# Patient Record
Sex: Male | Born: 1990 | Race: Black or African American | Hispanic: No | Smoking: Never smoker
Health system: Southern US, Community
[De-identification: ages and names within clinical notes are randomized; demographics above are authoritative.]

---

## 2012-09-05 ENCOUNTER — Emergency Department (HOSPITAL_BASED_OUTPATIENT_CLINIC_OR_DEPARTMENT_OTHER)
Admission: EM | Admit: 2012-09-05 | Discharge: 2012-09-05 | Disposition: A | Discharge status: Home / Self Care | Payer: 59 | Attending: Emergency Medicine | Admitting: Emergency Medicine

## 2012-09-05 ENCOUNTER — Encounter (HOSPITAL_BASED_OUTPATIENT_CLINIC_OR_DEPARTMENT_OTHER): Payer: Self-pay

## 2012-09-05 DIAGNOSIS — R110 Nausea: Secondary | ICD-10-CM | POA: Insufficient documentation

## 2012-09-05 DIAGNOSIS — Y9389 Activity, other specified: Secondary | ICD-10-CM | POA: Insufficient documentation

## 2012-09-05 DIAGNOSIS — Y9289 Other specified places as the place of occurrence of the external cause: Secondary | ICD-10-CM | POA: Insufficient documentation

## 2012-09-05 DIAGNOSIS — X31XXXA Exposure to excessive natural cold, initial encounter: Secondary | ICD-10-CM | POA: Insufficient documentation

## 2012-09-05 DIAGNOSIS — T698XXA Other specified effects of reduced temperature, initial encounter: Secondary | ICD-10-CM | POA: Insufficient documentation

## 2012-09-05 MED ORDER — PROMETHAZINE HCL 25 MG/ML IJ SOLN
INTRAMUSCULAR | Status: AC
  Administered 2012-09-05: 12.5 mg via INTRAMUSCULAR
  Filled 2012-09-05: qty 1

## 2012-09-05 MED ORDER — PROMETHAZINE HCL 25 MG/ML IJ SOLN: 12.5000 mg | Freq: Once | INTRAMUSCULAR | Status: AC

## 2012-09-05 MED ORDER — ONDANSETRON 8 MG PO TBDP: ORAL_TABLET | ORAL | Status: DC

## 2012-09-05 MED ORDER — ONDANSETRON 8 MG PO TBDP: 8.0000 mg | ORAL_TABLET | Freq: Once | ORAL | Status: AC

## 2012-09-05 MED ORDER — ONDANSETRON 8 MG PO TBDP
ORAL_TABLET | ORAL | Status: AC
  Administered 2012-09-05: 8 mg via ORAL
  Filled 2012-09-05: qty 1

## 2012-09-05 NOTE — ED Provider Notes (Signed)
History     CSN: [id number]  Arrival date & time 09/05/12  0139   First MD Initiated Contact with Patient 09/05/12 [phone]      Chief Complaint  Patient presents with  . Cold Exposure  . Nausea  Patient awoke feeling queasy this am.  Has been exposed to coworkers with n/v/d.  Went to work [employer] and the symptoms got worse over 3 hours.  No vomiting. No f/c/r.  No shivering no diarrhea.  Was covered the entire time.  No trauma.  No sore throat.  No LOC.    (Consider location/radiation/quality/duration/timing/severity/associated sxs/prior treatment) The history is provided by the patient.    History reviewed. No pertinent past medical history.  History reviewed. No pertinent past surgical history.  History reviewed. No pertinent family history.  History  Substance Use Topics  . Smoking status: Not on file  . Smokeless tobacco: Not on file  . Alcohol Use: Not on file      Review of Systems  Constitutional: Negative for fever.  HENT: Negative for neck pain and neck stiffness.   Respiratory: Negative for chest tightness.   Cardiovascular: Negative for chest pain.  Gastrointestinal: Positive for nausea. Negative for vomiting and diarrhea.  All other systems reviewed and are negative.    Allergies  Review of patient's allergies indicates no known allergies.  Home Medications  No current outpatient prescriptions on file.  BP 113/78  Pulse 63  Temp(Src) 97.3 F (36.3 C) (Oral)  Resp 18  Ht 5\' 9"  (1.753 m)  Wt 205 lb (92.987 kg)  BMI 30.26 kg/m2  SpO2 99%  Physical Exam  Constitutional: He is oriented to person, place, and time. He appears well-developed and well-nourished. No distress.  HENT:  Head: Normocephalic and atraumatic.  Right Ear: Tympanic membrane is not injected.  Left Ear: Tympanic membrane is not injected.  Mouth/Throat: Oropharynx is clear and moist.  Eyes: Conjunctivae are normal. Pupils are equal, round, and reactive to light.  Neck:  Normal range of motion. Neck supple.  Cardiovascular: Normal rate, regular rhythm and intact distal pulses.   Pulmonary/Chest: Effort normal and breath sounds normal. He has no wheezes. He has no rales.  Abdominal: Soft. Bowel sounds are normal. There is no tenderness. There is no rebound and no guarding.  Musculoskeletal: Normal range of motion.  Lymphadenopathy:    He has no cervical adenopathy.  Neurological: He is alert and oriented to person, place, and time. He has normal reflexes.  Skin: Skin is warm and dry.  Psychiatric: He has a normal mood and affect.    ED Course  Procedures (including critical care time)  Labs Reviewed - No data to display No results found.   No diagnosis found.    MDM  Patient was bundled up in coveralls and sweat shirt.  He felt queasy before the shift started. Patient has been exposed to coworkers with the same symptoms suspect viral etiology. Exam and vitals reassuring no indication for labs or imaging at this time.  Symptomatic relief,   As not vomiting or diarrhea and temp appropriate will provide zofran and warm blankets and po challenge.  Po challenged successfully.  Will provide RX for antiemetics and bland diet instructions.  Return for any abdominal pain intractable vomiting fevers or any concerns        April K Palumbo-Rasch, MD 09/05/12 [phone]

## 2012-09-05 NOTE — ED Notes (Signed)
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  Pt states that he became lightheaded, nauseous, and had experienced tingling in his extremities.  Pt states that he is still cold, and that remains nauseous at this time.

## 2012-09-05 NOTE — ED Notes (Signed)
Pt states that nausea still persists despite zofran odt, orders received for phenergan IM injection.  Pt sipping ginger ale and resting under warm blankets.

## 2013-01-16 ENCOUNTER — Encounter (HOSPITAL_COMMUNITY): Payer: Self-pay | Admitting: Emergency Medicine

## 2013-01-16 ENCOUNTER — Emergency Department (HOSPITAL_COMMUNITY)
Admission: EM | Admit: 2013-01-16 | Discharge: 2013-01-16 | Disposition: A | Discharge status: Home / Self Care | Payer: 59 | Attending: Emergency Medicine | Admitting: Emergency Medicine

## 2013-01-16 ENCOUNTER — Emergency Department (HOSPITAL_COMMUNITY): Payer: 59

## 2013-01-16 DIAGNOSIS — S6000XA Contusion of unspecified finger without damage to nail, initial encounter: Secondary | ICD-10-CM | POA: Insufficient documentation

## 2013-01-16 DIAGNOSIS — W230XXA Caught, crushed, jammed, or pinched between moving objects, initial encounter: Secondary | ICD-10-CM | POA: Insufficient documentation

## 2013-01-16 DIAGNOSIS — S60011A Contusion of right thumb without damage to nail, initial encounter: Secondary | ICD-10-CM

## 2013-01-16 DIAGNOSIS — Y939 Activity, unspecified: Secondary | ICD-10-CM | POA: Insufficient documentation

## 2013-01-16 DIAGNOSIS — Y929 Unspecified place or not applicable: Secondary | ICD-10-CM | POA: Insufficient documentation

## 2013-01-16 MED ORDER — OXYCODONE-ACETAMINOPHEN 5-325 MG PO TABS
1.0000 | ORAL_TABLET | Freq: Once | ORAL | Status: AC
  Administered 2013-01-16: 1 via ORAL
  Filled 2013-01-16: qty 1

## 2013-01-16 MED ORDER — HYDROCODONE-ACETAMINOPHEN 5-325 MG PO TABS: 1.0000 | ORAL_TABLET | ORAL | Status: DC | PRN

## 2013-01-16 NOTE — ED Provider Notes (Signed)
   History    CSN: [id number] Arrival date & time 01/16/13  4098  First MD Initiated Contact with Patient 01/16/13 [phone]     Chief Complaint  Patient presents with  . Finger Injury   (Consider location/radiation/quality/duration/timing/severity/associated sxs/prior Treatment) HPI History provided by pt.   Pt slammed his right thumb in a car door this morning and presents w/ severe pain of distal phalanx that is aggravated by palpation and flexion of DIP joint.  Associated w/ subungal hematoma.  Denies paresthesias.  Has had some relief w/ the percocet he received here in ED. History reviewed. No pertinent past medical history. History reviewed. No pertinent past surgical history. No family history on file. History  Substance Use Topics  . Smoking status: Never Smoker   . Smokeless tobacco: Not on file  . Alcohol Use: No    Review of Systems  All other systems reviewed and are negative.    Allergies  Review of patient's allergies indicates no known allergies.  Home Medications  No current outpatient prescriptions on file. BP 120/55  Pulse 65  Temp(Src) 98.5 F (36.9 C) (Oral)  SpO2 100% Physical Exam  Nursing note and vitals reviewed. Constitutional: He is oriented to person, place, and time. He appears well-developed and well-nourished. No distress.  HENT:  Head: Normocephalic and atraumatic.  Eyes:  Normal appearance  Neck: Normal range of motion.  Pulmonary/Chest: Effort normal.  Musculoskeletal: Normal range of motion.  No deformity of right thumb. Subungal hematoma.  Mild edema distal thumb.  Pain w/ light palpation over distal phalanx.  Pain w/ minimal passive flexion DIP.  Distal sensation intact.   Neurological: He is alert and oriented to person, place, and time.  Psychiatric: He has a normal mood and affect. His behavior is normal.    ED Course  INCISION AND DRAINAGE Date/Time: 01/16/2013 4:29 AM Performed by: Otilio Miu Authorized by:  Ruby Cola E Consent: Verbal consent obtained. Risks and benefits: risks, benefits and alternatives were discussed Consent given by: patient Type: subungual hematoma Body area: upper extremity Location details: right thumb Patient sedated: no Complexity: simple Drainage: bloody Drainage amount: moderate Wound treatment: wound left open Patient tolerance: Patient tolerated the procedure well with no immediate complications. Comments: electrocautery device used to make a 2mm opening in nail.     (including critical care time) Labs Reviewed - No data to display Dg Finger Thumb Right  01/16/2013   *RADIOLOGY REPORT*  Clinical Data: Slammed right thumb in car door; thumb pain, and blood under the right thumb nailbed.  RIGHT THUMB 2+V  Comparison: None.  Findings: There is no evidence of fracture or dislocation.  Known soft tissue abnormalities are not well characterized on radiograph. Visualized joint spaces are preserved.  IMPRESSION: No evidence of fracture or dislocation.   Original Report Authenticated By: Tonia Ghent, M.D.   1. Contusion of right thumb, initial encounter     MDM  Pt presents w/ subungual hematoma R thumb.  Nail trephination performed w/ some relief of pain. Pt understands that he may lose the nail. Return precautions discussed.   Otilio Miu, PA-C 01/16/13 [phone]

## 2013-01-16 NOTE — ED Notes (Signed)
Patient states that he slammed his right thumb in the car door

## 2013-01-16 NOTE — ED Provider Notes (Signed)
Medical screening examination/treatment/procedure(s) were performed by non-physician practitioner and as supervising physician I was immediately available for consultation/collaboration.  Deondrea Markos K Snow Peoples-Rasch, MD 01/16/13 2343 

## 2016-08-03 ENCOUNTER — Emergency Department (HOSPITAL_COMMUNITY)
Admission: EM | Admit: 2016-08-03 | Discharge: 2016-08-04 | Disposition: A | Discharge status: Home / Self Care | Payer: BLUE CROSS/BLUE SHIELD | Attending: Emergency Medicine | Admitting: Emergency Medicine

## 2016-08-03 ENCOUNTER — Encounter (HOSPITAL_COMMUNITY): Payer: Self-pay | Admitting: Emergency Medicine

## 2016-08-03 DIAGNOSIS — R0789 Other chest pain: Secondary | ICD-10-CM | POA: Diagnosis not present

## 2016-08-03 DIAGNOSIS — R748 Abnormal levels of other serum enzymes: Secondary | ICD-10-CM | POA: Insufficient documentation

## 2016-08-03 DIAGNOSIS — R7989 Other specified abnormal findings of blood chemistry: Secondary | ICD-10-CM

## 2016-08-03 DIAGNOSIS — R079 Chest pain, unspecified: Secondary | ICD-10-CM

## 2016-08-03 NOTE — ED Notes (Signed)
Bed: [id number] Expected date:  Expected time:  Means of arrival:  Comments: 65F/PNA/hypoxia

## 2016-08-03 NOTE — ED Triage Notes (Signed)
Patient states he left the gym and started having chest pain. Patient states the pain started around 2245. Patient is continuously shaking. Patient states he has not had any drugs.

## 2016-08-03 NOTE — ED Notes (Signed)
EDP needs 2nd EKG (pt moving)

## 2016-08-04 ENCOUNTER — Emergency Department (HOSPITAL_COMMUNITY): Payer: BLUE CROSS/BLUE SHIELD

## 2016-08-04 LAB — COMPREHENSIVE METABOLIC PANEL
ALBUMIN: 4.7 g/dL (ref 3.5–5.0)
ALT: 29 U/L (ref 17–63)
ANION GAP: 9 (ref 5–15)
AST: 36 U/L (ref 15–41)
Alkaline Phosphatase: 59 U/L (ref 38–126)
BILIRUBIN TOTAL: 1.2 mg/dL (ref 0.3–1.2)
BUN: 19 mg/dL (ref 6–20)
CALCIUM: 9.2 mg/dL (ref 8.9–10.3)
CO2: 24 mmol/L (ref 22–32)
Chloride: 103 mmol/L (ref 101–111)
Creatinine, Ser: 1.51 mg/dL — ABNORMAL HIGH (ref 0.61–1.24)
GFR calc Af Amer: >60 mL/min (ref >60)
GLUCOSE: 100 mg/dL — AB (ref 65–99)
Potassium: 3.6 mmol/L (ref 3.5–5.1)
Sodium: 136 mmol/L (ref 135–145)
TOTAL PROTEIN: 8 g/dL (ref 6.5–8.1)

## 2016-08-04 LAB — CBC WITH DIFFERENTIAL/PLATELET
BASOS ABS: 0 10*3/uL (ref 0.0–0.1)
BASOS PCT: 1 %
EOS PCT: 2 %
Eosinophils Absolute: 0.1 10*3/uL (ref 0.0–0.7)
HCT: 36.3 % — ABNORMAL LOW (ref 39.0–52.0)
Hemoglobin: 12.1 g/dL — ABNORMAL LOW (ref 13.0–17.0)
Lymphocytes Relative: 41 %
Lymphs Abs: 1.9 10*3/uL (ref 0.7–4.0)
MCH: 28.1 pg (ref 26.0–34.0)
MCHC: 33.3 g/dL (ref 30.0–36.0)
MCV: 84.4 fL (ref 78.0–100.0)
MONO ABS: 0.4 10*3/uL (ref 0.1–1.0)
Monocytes Relative: 9 %
NEUTROS ABS: 2.3 10*3/uL (ref 1.7–7.7)
Neutrophils Relative %: 47 %
Platelets: 212 10*3/uL (ref 150–400)
RBC: 4.3 MIL/uL (ref 4.22–5.81)
RDW: 14.4 % (ref 11.5–15.5)
WBC: 4.7 10*3/uL (ref 4.0–10.5)

## 2016-08-04 LAB — I-STAT TROPONIN, ED: Troponin i, poc: 0 ng/mL (ref 0.00–0.08)

## 2016-08-04 LAB — LIPASE, BLOOD: Lipase: 18 U/L (ref 11–51)

## 2016-08-04 MED ORDER — SODIUM CHLORIDE 0.9 % IV BOLUS (SEPSIS)
1000.0000 mL | Freq: Once | INTRAVENOUS | Status: AC
  Administered 2016-08-04: 1000 mL via INTRAVENOUS

## 2016-08-04 NOTE — Discharge Instructions (Signed)
1. Medications: usual home medications 2. Treatment: rest, drink plenty of fluids,  3. Follow Up: Please followup with your primary doctor in 2-3 days for discussion of your diagnoses and further evaluation after today's visit; if you do not have a primary care doctor use the resource guide provided to find one; Please return to the ER for worsening symptoms, pain that radiates into the jaw or back, diaphoresis or other concerns.

## 2016-08-04 NOTE — ED Provider Notes (Signed)
[facility]-EMERGENCY DEPT Provider Note   CSN: [id number] Arrival date & time: 08/03/16  2349  By signing my name below, I, Valentino SaxonBianca Contreras, attest that this documentation has been prepared under the direction and in the presence of TXU CorpHannah Chevonne Bostrom, PA-C. Electronically Signed: Valentino SaxonBianca Contreras, ED Scribe. 08/04/16. 12:26 AM.  History   Chief Complaint Chief Complaint  Patient presents with  . Chest Pain   The history is provided by the patient and medical records. No language interpreter was used.   HPI Comments: Ralph Bailey is a 26 y.o. male who presents to the Emergency Department complaining of moderate, constant, left-sided chest pain onset today tonight. Pt describes his pain as a tightness that radiates to the left side of his neck and jaw. He denies recent trauma or injury to his neck. He notes he was driving when the sudden onset of chest pain came across his left chest area accompanied by SOB. Pt reports associated nausea, numbness in his fingers and blurry vision which have subsided PTA. Pt notes he went to the gym, working his arms and shoulders, prior to having his chest pain. No alleviating factors noted. He denies having a hx of similar symptoms. Pt also denies taking medications. He denies abdominal pain or swelling in lower extremities.   History reviewed. No pertinent past medical history.  There are no active problems to display for this patient.   History reviewed. No pertinent surgical history.     Home Medications    Prior to Admission medications   Medication Sig Start Date End Date Taking? Authorizing Provider  Multiple Vitamin (MULTIVITAMIN WITH MINERALS) TABS tablet Take 1 tablet by mouth daily.   Yes Historical Provider, MD    Family History History reviewed. No pertinent family history.  Social History Social History  Substance Use Topics  . Smoking status: Never Smoker  . Smokeless tobacco: Never Used  . Alcohol use No     Allergies     Patient has no known allergies.   Review of Systems Review of Systems  Eyes: Positive for visual disturbance (blurry vision).  Respiratory: Positive for shortness of breath.   Cardiovascular: Positive for chest pain. Negative for leg swelling.  Gastrointestinal: Positive for nausea. Negative for abdominal pain.  Neurological: Positive for numbness.  All other systems reviewed and are negative.    Physical Exam Updated Vital Signs BP 147/83   Pulse (!) 58   Temp 97.7 F (36.5 C) (Oral)   Resp 19   Ht 5\' 9"  (1.753 m)   Wt 200 lb (90.7 kg)   SpO2 99%   BMI 29.53 kg/m   Physical Exam  Constitutional: He appears well-developed and well-nourished. No distress.  Awake, alert, nontoxic appearance  HENT:  Head: Normocephalic and atraumatic.  Mouth/Throat: Oropharynx is clear and moist. No oropharyngeal exudate.  Eyes: Conjunctivae are normal. No scleral icterus.  Neck: Normal range of motion. Neck supple.  Cardiovascular: Normal rate, regular rhythm and intact distal pulses.   Pulmonary/Chest: Effort normal and breath sounds normal. No respiratory distress. He has no wheezes.  Equal chest expansion  Abdominal: Soft. Bowel sounds are normal. He exhibits no mass. There is no tenderness. There is no rebound and no guarding.  Musculoskeletal: Normal range of motion. He exhibits no edema.  Neurological: He is alert.  Speech is clear and goal oriented Moves extremities without ataxia  Skin: Skin is warm and dry. He is not diaphoretic.  Psychiatric: He has a normal mood and affect.  Nursing note and  vitals reviewed.    ED Treatments / Results   DIAGNOSTIC STUDIES: Oxygen Saturation is 100% on RA, normal by my interpretation.    COORDINATION OF CARE: 12:22 AM Discussed treatment plan with pt at bedside which includes labs, chest imaging and EKG and pt agreed to plan.   Labs (all labs ordered are listed, but only abnormal results are displayed) Labs Reviewed   COMPREHENSIVE METABOLIC PANEL - Abnormal; Notable for the following:       Result Value   Glucose, Bld 100 (*)    Creatinine, Ser 1.51 (*)    All other components within normal limits  CBC WITH DIFFERENTIAL/PLATELET - Abnormal; Notable for the following:    Hemoglobin 12.1 (*)    HCT 36.3 (*)    All other components within normal limits  LIPASE, BLOOD  I-STAT TROPOININ, ED    EKG  EKG Interpretation  Date/Time:  Saturday August 04 2016 00:02:06 EST Ventricular Rate:  75 PR Interval:    QRS Duration: 86 QT Interval:  381 QTC Calculation: 426 R Axis:   90 Text Interpretation:  Sinus rhythm Anteroseptal infarct, age indeterminate No old tracing to compare Confirmed by [facility]  MD, DAVID ([id number]) on 08/04/2016 12:19:03 AM       Radiology Dg Chest 2 View  Result Date: 08/04/2016 CLINICAL DATA:  Chest pain after exercise tonight, onset at 22:45 EXAM: CHEST  2 VIEW COMPARISON:  None. FINDINGS: The heart size and mediastinal contours are within normal limits. Both lungs are clear. The visualized skeletal structures are unremarkable. IMPRESSION: No active cardiopulmonary disease. Electronically Signed   By: Ellery Plunk M.D.   On: 08/04/2016 00:23    Procedures Procedures (including critical care time)  Medications Ordered in ED Medications  sodium chloride 0.9 % bolus 1,000 mL (1,000 mLs Intravenous New Bag/Given 08/04/16 0156)     Initial Impression / Assessment and Plan / ED Course  I have reviewed the triage vital signs and the nursing notes.  Pertinent labs & imaging results that were available during my care of the patient were reviewed by me and considered in my medical decision making (see chart for details).     Patient is to be discharged with recommendation to follow up with PCP in regards to today's hospital visit. Chest pain is not likely of cardiac or pulmonary etiology d/t presentation, PERC negative, VSS, no tracheal deviation, no JVD or new murmur, RRR,  breath sounds equal bilaterally, EKG without ischemic changes and no old for comparison, negative troponin, and negative CXR. Episode was likely 2/2 increased stress at home. Discussed stress management and panic attacks.  She has noted to have elevated serum creatinine; this is likely due to the routine and workout supplements that he is using. Patient has been given fluid bolus. Discussed stopping supplements and patient will need repeat blood draw in one week for further evaluation. Pt has been advised to return to the ED if CP becomes exertional, associated with diaphoresis or nausea, radiates to left jaw/arm, worsens or becomes concerning in any way. Pt appears reliable for follow up and is agreeable to discharge.   Case has been discussed with and seen by Dr. Preston Fleeting who agrees with the above plan to discharge.   Final Clinical Impressions(s) / ED Diagnoses   Final diagnoses:  Chest pain, unspecified type  Elevated serum creatinine    New Prescriptions Current Discharge Medication List      I personally performed the services described in this documentation, which was scribed  in my presence. The recorded information has been reviewed and is accurate.     Dahlia Client Alyissa Whidbee, PA-C 08/04/16 1610    Dione Booze, MD 08/04/16 [phone]

## 2017-08-07 ENCOUNTER — Encounter (HOSPITAL_COMMUNITY): Payer: Self-pay | Admitting: Emergency Medicine

## 2017-08-07 ENCOUNTER — Emergency Department (HOSPITAL_COMMUNITY): Payer: Self-pay

## 2017-08-07 ENCOUNTER — Other Ambulatory Visit: Payer: Self-pay

## 2017-08-07 ENCOUNTER — Emergency Department (HOSPITAL_COMMUNITY)
Admission: EM | Admit: 2017-08-07 | Discharge: 2017-08-08 | Disposition: A | Discharge status: Home / Self Care | Payer: Self-pay | Attending: Emergency Medicine | Admitting: Emergency Medicine

## 2017-08-07 DIAGNOSIS — Y9375 Activity, martial arts: Secondary | ICD-10-CM | POA: Insufficient documentation

## 2017-08-07 DIAGNOSIS — S43005A Unspecified dislocation of left shoulder joint, initial encounter: Secondary | ICD-10-CM

## 2017-08-07 DIAGNOSIS — Y92838 Other recreation area as the place of occurrence of the external cause: Secondary | ICD-10-CM | POA: Insufficient documentation

## 2017-08-07 DIAGNOSIS — W51XXXA Accidental striking against or bumped into by another person, initial encounter: Secondary | ICD-10-CM | POA: Insufficient documentation

## 2017-08-07 DIAGNOSIS — S43085A Other dislocation of left shoulder joint, initial encounter: Secondary | ICD-10-CM | POA: Insufficient documentation

## 2017-08-07 DIAGNOSIS — Y998 Other external cause status: Secondary | ICD-10-CM | POA: Insufficient documentation

## 2017-08-07 MED ORDER — OXYCODONE-ACETAMINOPHEN 5-325 MG PO TABS
1.0000 | ORAL_TABLET | ORAL | Status: AC | PRN
  Administered 2017-08-07 – 2017-08-08 (×2): 1 via ORAL
  Filled 2017-08-07 (×2): qty 1

## 2017-08-07 MED ORDER — FENTANYL CITRATE (PF) 100 MCG/2ML IJ SOLN
100.0000 ug | Freq: Once | INTRAMUSCULAR | Status: AC
  Administered 2017-08-07: 100 ug via INTRAVENOUS
  Filled 2017-08-07: qty 2

## 2017-08-07 MED ORDER — PROPOFOL 10 MG/ML IV BOLUS
INTRAVENOUS | Status: AC | PRN
Start: 2017-08-07 — End: 2017-08-07
  Administered 2017-08-07 (×2): 40 mg via INTRAVENOUS
  Administered 2017-08-07: 20 mg via INTRAVENOUS
  Administered 2017-08-07: 40 mg via INTRAVENOUS

## 2017-08-07 MED ORDER — PROPOFOL 10 MG/ML IV BOLUS
1.0000 mg/kg | Freq: Once | INTRAVENOUS | Status: DC
  Filled 2017-08-07: qty 20

## 2017-08-07 NOTE — ED Provider Notes (Signed)
Patient seen/examined in the Emergency Department in conjunction with Midlevel Provider McDonald Patient reports left shoulder pain after injury noted judo.  He has left shoulder dislocation. Exam : Awake alert no distress, obvious deformity left shoulder, distal pulses intact, no cervical spine tenderness.  No chest wall tenderness. Plan: closed reduction with propofol sedation    Ralph Bailey, Ralph Klebba, MD 08/07/17 2332

## 2017-08-07 NOTE — ED Provider Notes (Signed)
[facility] Provider Note   CSN: [id number] Arrival date & time: 08/07/17  2118     History   Chief Complaint Chief Complaint  Patient presents with  . Shoulder Injury    HPI Ralph Bailey is a 27 y.o. male who presents to the emergency department with a chief complaint of worsening left shoulder injury after he was pinned against another person while practicing judo earlier at approximately 19:30.  He endorses constant, sharp pain to the left shoulder.  No history of previous left shoulder surgery or injury.  He denies numbness or weakness to the left upper extremity.  He denies left elbow, right shoulder, neck, back, chest pain, or dyspnea.  He denies hitting his head, LOC, nausea, or emesis.  He is splinted of the left shoulder himself prior to arrival.  He last ate around 3 PM.  He had a few ounces of water about 15 minutes prior to arrival.  No known allergies to medication.  No history of cardiopulmonary disease.  The history is provided by the patient. No language interpreter was used.  Shoulder Injury  This is a new problem. The current episode started 3 to 5 hours ago. The problem occurs constantly. The problem has not changed since onset.Pertinent negatives include no chest pain, no abdominal pain and no shortness of breath. The symptoms are aggravated by bending. Nothing relieves the symptoms. Treatments tried: homemade splint. The treatment provided mild relief.    History reviewed. No pertinent past medical history.  There are no active problems to display for this patient.   History reviewed. No pertinent surgical history.     Home Medications    Prior to Admission medications   Medication Sig Start Date End Date Taking? Authorizing Provider  oxyCODONE-acetaminophen (PERCOCET/ROXICET) 5-325 MG tablet Take 1 tablet by mouth every 6 (six) hours as needed for severe pain. 08/08/17   Jamani Bearce A, PA-C    Family  History No family history on file.  Social History Social History   Tobacco Use  . Smoking status: Never Smoker  . Smokeless tobacco: Never Used  Substance Use Topics  . Alcohol use: Yes    Comment: occasional  . Drug use: No     Allergies   Patient has no known allergies.   Review of Systems Review of Systems  Constitutional: Negative for activity change.  Respiratory: Negative for shortness of breath.   Cardiovascular: Negative for chest pain.  Gastrointestinal: Negative for abdominal pain.  Musculoskeletal: Positive for arthralgias and myalgias. Negative for back pain and joint swelling.  Skin: Negative for rash.  Neurological: Negative for weakness and numbness.     Physical Exam Updated Vital Signs BP (!) 163/85   Pulse 64   Temp 98.9 F (37.2 C) (Oral)   Resp (!) 24   Ht 5\' 9"  (1.753 m)   Wt 93 kg (205 lb)   SpO2 99%   BMI 30.27 kg/m   Physical Exam  Constitutional: He appears well-developed.  HENT:  Head: Normocephalic.  Eyes: Conjunctivae are normal.  Neck: Neck supple.  Cardiovascular: Normal rate, regular rhythm, normal heart sounds and intact distal pulses. Exam reveals no gallop and no friction rub.  No murmur heard. Pulmonary/Chest: Effort normal. No stridor. No respiratory distress. He has no wheezes. He has no rales.  Abdominal: Soft. He exhibits no distension.  Musculoskeletal:  Diffusely tender to palpation over the left shoulder.  Decreased range of motion secondary to pain.  Decreased range of  motion to the left elbow also secondary to pain in the left shoulder.  Radial pulses are 2+ and symmetric.  Sensation is intact throughout the bilateral upper extremities.  He is able to independently move all digits of the left hand.  No obvious deformity of the left shoulder secondary to hypertrophy of the left deltoid and muscles of the left upper arm.  Neurological: He is alert.  Skin: Skin is warm and dry.  Psychiatric: His behavior is normal.   Nursing note and vitals reviewed.    ED Treatments / Results  Labs (all labs ordered are listed, but only abnormal results are displayed) Labs Reviewed - No data to display  EKG  EKG Interpretation None       Radiology Dg Shoulder Left  Result Date: 08/07/2017 CLINICAL DATA:  Martial arts injury today. Pain and limited range of motion. EXAM: LEFT SHOULDER - 2+ VIEW COMPARISON:  None. FINDINGS: Anterior inferior dislocation of the humeral head. No visible glenoid or humeral fracture at this time. Other structures appear normal. IMPRESSION: Anterior inferior dislocation of the humeral head. Electronically Signed   By: Paulina Fusi M.D.   On: 08/07/2017 22:12   Dg Shoulder Left Portable  Result Date: 08/08/2017 CLINICAL DATA:  Status post reduction of left shoulder dislocation. Initial encounter. EXAM: LEFT SHOULDER - 1 VIEW COMPARISON:  Acute left shoulder radiographs performed earlier today at 10:06 p.m. FINDINGS: There has been successful reduction of the left glenohumeral joint dislocation. The left acromioclavicular joint is unremarkable in appearance. No definite fracture is seen. No definite soft tissue abnormalities are characterized on radiograph. The visualized portions of the left lung are clear. IMPRESSION: Successful reduction of left glenohumeral joint dislocation. Electronically Signed   By: Roanna Raider M.D.   On: 08/08/2017 00:33    Procedures .Sedation Date/Time: 08/07/2017 11:38 PM Performed by: Zadie Rhine, MD Authorized by: Zadie Rhine, MD   Consent:    Consent obtained:  Verbal   Consent given by:  Patient   Risks discussed:  Allergic reaction, dysrhythmia, inadequate sedation, nausea, prolonged hypoxia resulting in organ damage, prolonged sedation necessitating reversal, respiratory compromise necessitating ventilatory assistance and intubation and vomiting   Alternatives discussed:  Analgesia without sedation, anxiolysis and regional  anesthesia Universal protocol:    Procedure explained and questions answered to patient or proxy's satisfaction: yes     Relevant documents present and verified: yes     Test results available and properly labeled: yes     Imaging studies available: yes     Required blood products, implants, devices, and special equipment available: yes     Site/side marked: yes     Immediately prior to procedure a time out was called: yes     Patient identity confirmation method:  Verbally with patient Indications:    Procedure performed:  Dislocation reduction   Procedure necessitating sedation performed by:  Physician performing sedation   Intended level of sedation:  Deep Pre-sedation assessment:    Time since last food or drink:  8 hours    ASA classification: class 1 - normal, healthy patient     Neck mobility: normal     Mouth opening:  3 or more finger widths   Thyromental distance:  4 finger widths   Mallampati score:  I - soft palate, uvula, fauces, pillars visible   Pre-sedation assessments completed and reviewed: airway patency, cardiovascular function, hydration status, mental status, nausea/vomiting, pain level, respiratory function and temperature     Pre-sedation assessment completed:  08/07/2017 11:40 PM Immediate pre-procedure details:    Reassessment: Patient reassessed immediately prior to procedure     Reviewed: vital signs, relevant labs/tests and NPO status     Verified: bag valve mask available, emergency equipment available, intubation equipment available, IV patency confirmed, oxygen available and suction available   Procedure details (see MAR for exact dosages):    Preoxygenation:  Nasal cannula   Sedation:  Propofol   Intra-procedure monitoring:  Blood pressure monitoring, cardiac monitor, continuous pulse oximetry, frequent LOC assessments, frequent vital sign checks and continuous capnometry   Intra-procedure events: none     Total Provider sedation time (minutes):   16 Post-procedure details:    Post-sedation assessment completed:  08/08/2017 12:56 AM   Attendance: Constant attendance by certified staff until patient recovered     Recovery: Patient returned to pre-procedure baseline     Post-sedation assessments completed and reviewed: airway patency, cardiovascular function, hydration status, mental status, nausea/vomiting, pain level, respiratory function and temperature     Patient is stable for discharge or admission: yes     Patient tolerance:  Tolerated well, no immediate complications Reduction of dislocation Date/Time: 08/07/2017 11:39 PM Performed by: Barkley Boards, PA-C Authorized by: Barkley Boards, PA-C  Consent: Verbal consent obtained. Written consent obtained. Risks and benefits: risks, benefits and alternatives were discussed Consent given by: patient Patient understanding: patient states understanding of the procedure being performed Patient consent: the patient's understanding of the procedure matches consent given Procedure consent: procedure consent matches procedure scheduled Relevant documents: relevant documents present and verified Test results: test results available and properly labeled Imaging studies: imaging studies available Patient identity confirmed: arm band Time out: Immediately prior to procedure a "time out" was called to verify the correct patient, procedure, equipment, support staff and site/side marked as required. Preparation: Patient was prepped and draped in the usual sterile fashion. Local anesthesia used: no  Anesthesia: Local anesthesia used: no  Sedation: Patient sedated: yes Sedation type: moderate (conscious) sedation Sedatives: propofol Analgesia: fentanyl Sedation start date/time: 08/07/2017 11:45 PM Sedation end date/time: 08/08/2017 12:01 AM Vitals: Vital signs were monitored during sedation.  Patient tolerance: Patient tolerated the procedure well with no immediate complications     (including critical care time)  Medications Ordered in ED Medications  propofol (DIPRIVAN) 10 mg/mL bolus/IV push 93 mg (not administered)  oxyCODONE-acetaminophen (PERCOCET/ROXICET) 5-325 MG per tablet 1 tablet (1 tablet Oral Given 08/08/17 0057)  fentaNYL (SUBLIMAZE) injection 100 mcg (100 mcg Intravenous Given 08/07/17 2318)  propofol (DIPRIVAN) 10 mg/mL bolus/IV push (40 mg Intravenous Given 08/07/17 2352)     Initial Impression / Assessment and Plan / ED Course  I have reviewed the triage vital signs and the nursing notes.  Pertinent labs & imaging results that were available during my care of the patient were reviewed by me and considered in my medical decision making (see chart for details).     27 year old male presenting with left shoulder injury.  Left shoulder x-ray consistent with anterior inferior left shoulder dislocation.  On physical exam, the patient is diffusely tender to palpation of the left shoulder.  He is refusing to move the left shoulder and left elbow secondary to pain.  NVI. He has well-developed musculature of the bilateral shoulder.  Discussed and evaluated the patient with Dr. Bebe Shaggy, attending physician. Discussed treatment options and risks and benefits.  Fentanyl given for pain control.  Propofol given for sedation.  No obvious deformity postreduction.  Reduction of the joint performed by  me with conscious sedation supervised by Dr. Hal Neer. Postreduction films demonstrate successful reduction of the left glenohumeral joint.  He remains neurovascularly intact.  On reexamination, the patient is alert and oriented x3.  He is successfully tolerating fluids.  Ambulatory without difficulty.  Will discharge the patient home with strict return precautions, referral to orthopedics for follow-up, and pain control.  Vital signs are stable.  He is in no acute distress.  Patient safe for discharge at this time. A 34-month prescription history query was performed using the [place] CSRS  prior to discharge.    Final Clinical Impressions(s) / ED Diagnoses   Final diagnoses:  Dislocation of left shoulder joint, initial encounter    ED Discharge Orders        Ordered    oxyCODONE-acetaminophen (PERCOCET/ROXICET) 5-325 MG tablet  Every 6 hours PRN     08/08/17 0052       Frederik Pear A, PA-C 08/08/17 0100    Zadie Rhine, MD 08/08/17 [phone]

## 2017-08-07 NOTE — ED Triage Notes (Signed)
Pt c/o left shoulder pain after someone fell on him during "judo". Pt splinted arm himself.

## 2017-08-08 ENCOUNTER — Emergency Department (HOSPITAL_COMMUNITY): Payer: Self-pay

## 2017-08-08 MED ORDER — OXYCODONE-ACETAMINOPHEN 5-325 MG PO TABS: 1.0000 | ORAL_TABLET | Freq: Four times a day (QID) | ORAL | 0 refills | Status: AC | PRN

## 2017-08-08 NOTE — Discharge Instructions (Signed)
Please call Dr. Luvenia StarchHaddix's office tomorrow morning to schedule a follow-up appointment.  Please wear the shoulder immobilizer and do not remove it even for showering until you are seen by Dr. Jena GaussHaddix.  For the next few days, your shoulder is more prone to getting dislocated again.  This will improve with some time.  For pain control, you can take 800 mg of ibuprofen every 8 hours as long as you take it with food or thousand milligrams of Tylenol every 8 hours.  For severe pain, you can take 1 tablet of Percocet every 6 hours as needed.  Please note, each tablet of Percocet has 325 mg of Tylenol.  Do not take more than 4000 mg of Tylenol from all combined sources in a 24-hour period.  Percocet is a narcotic and can also be addicting.  Please do not drive or work while taking this medication because it can make you drowsy and impair your ability to drive or make decisions.  If you develop any new or worsening symptoms, including a new fall or injury, numbness, or weakness in the left arm, please return to the emergency department for re-evaluation.

## 2018-02-21 IMAGING — DX DG SHOULDER 2+V*L*
2 series · 2 of 2 positions shown · non-contrast
Comparison: None.

CLINICAL DATA: Martial arts injury today. Pain and limited range of
motion.

EXAM:
LEFT SHOULDER - 2+ VIEW

[w shoulder internal left]
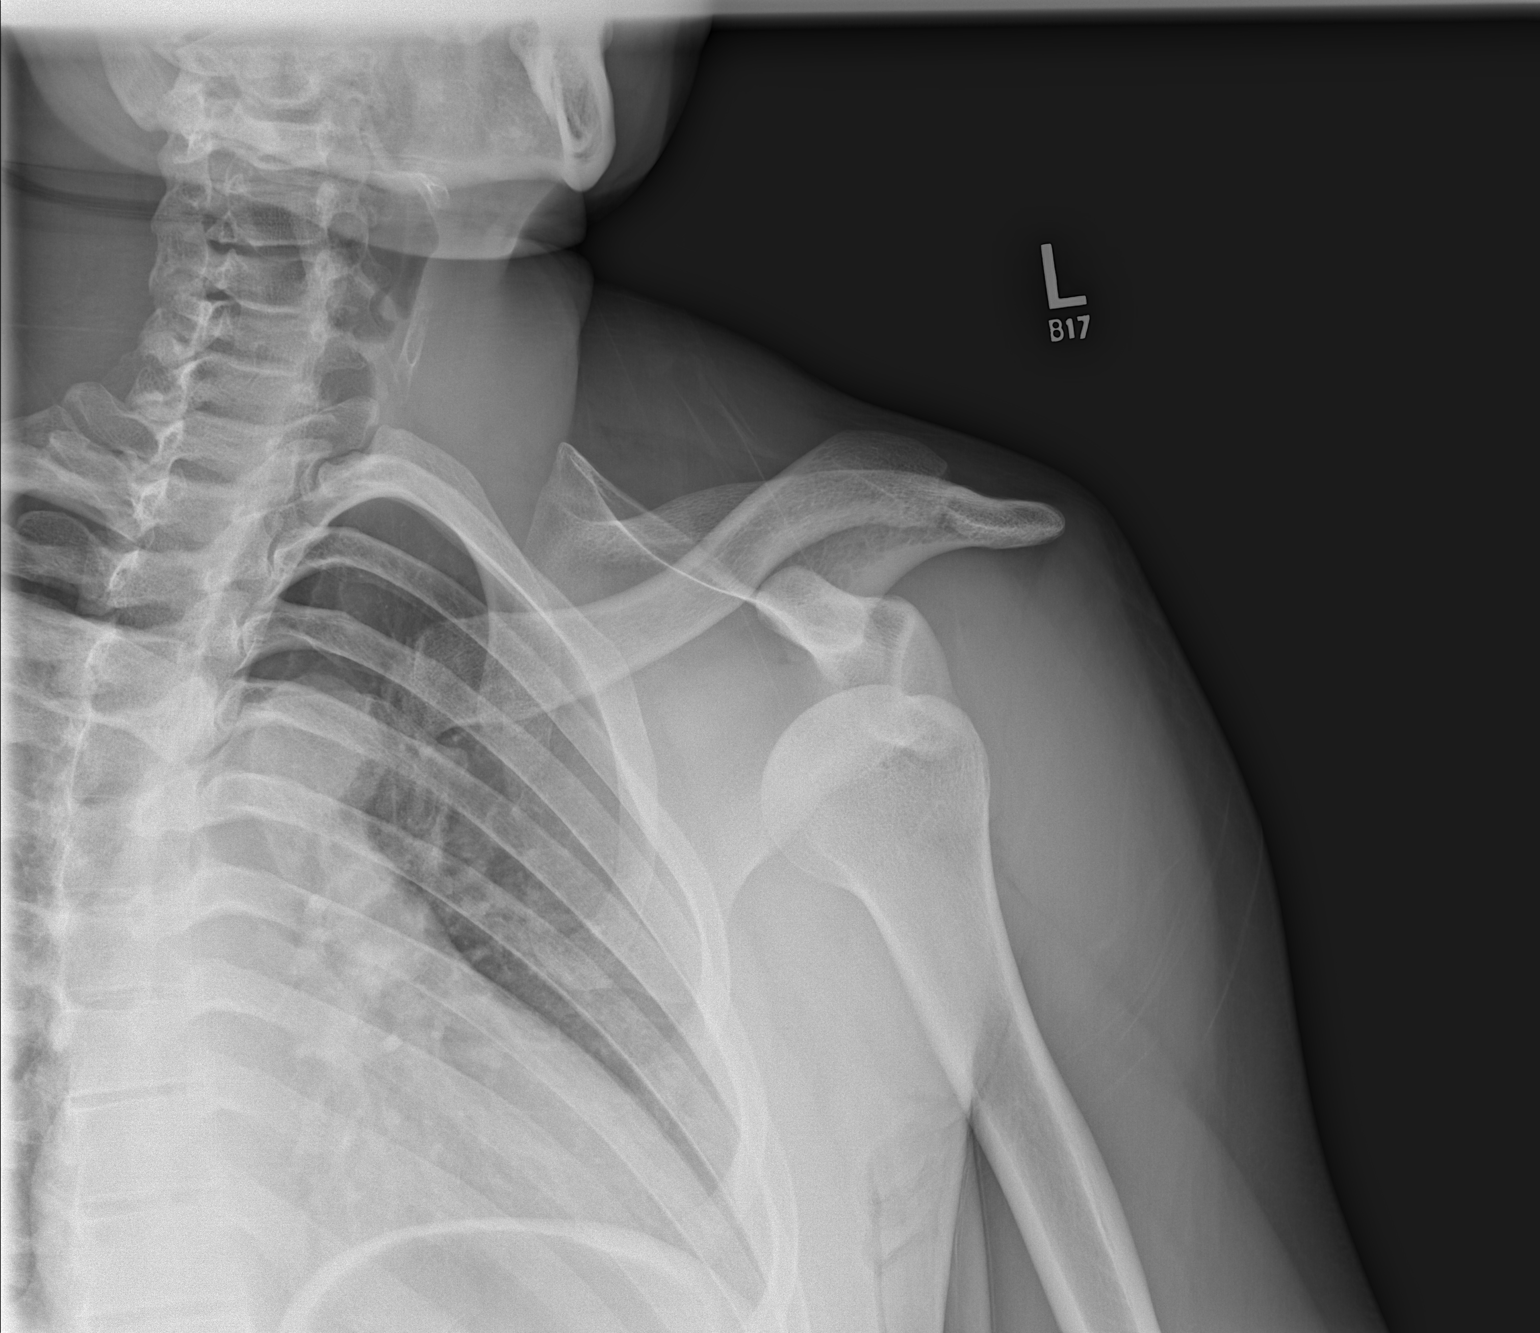

[w shoulder y-view left]
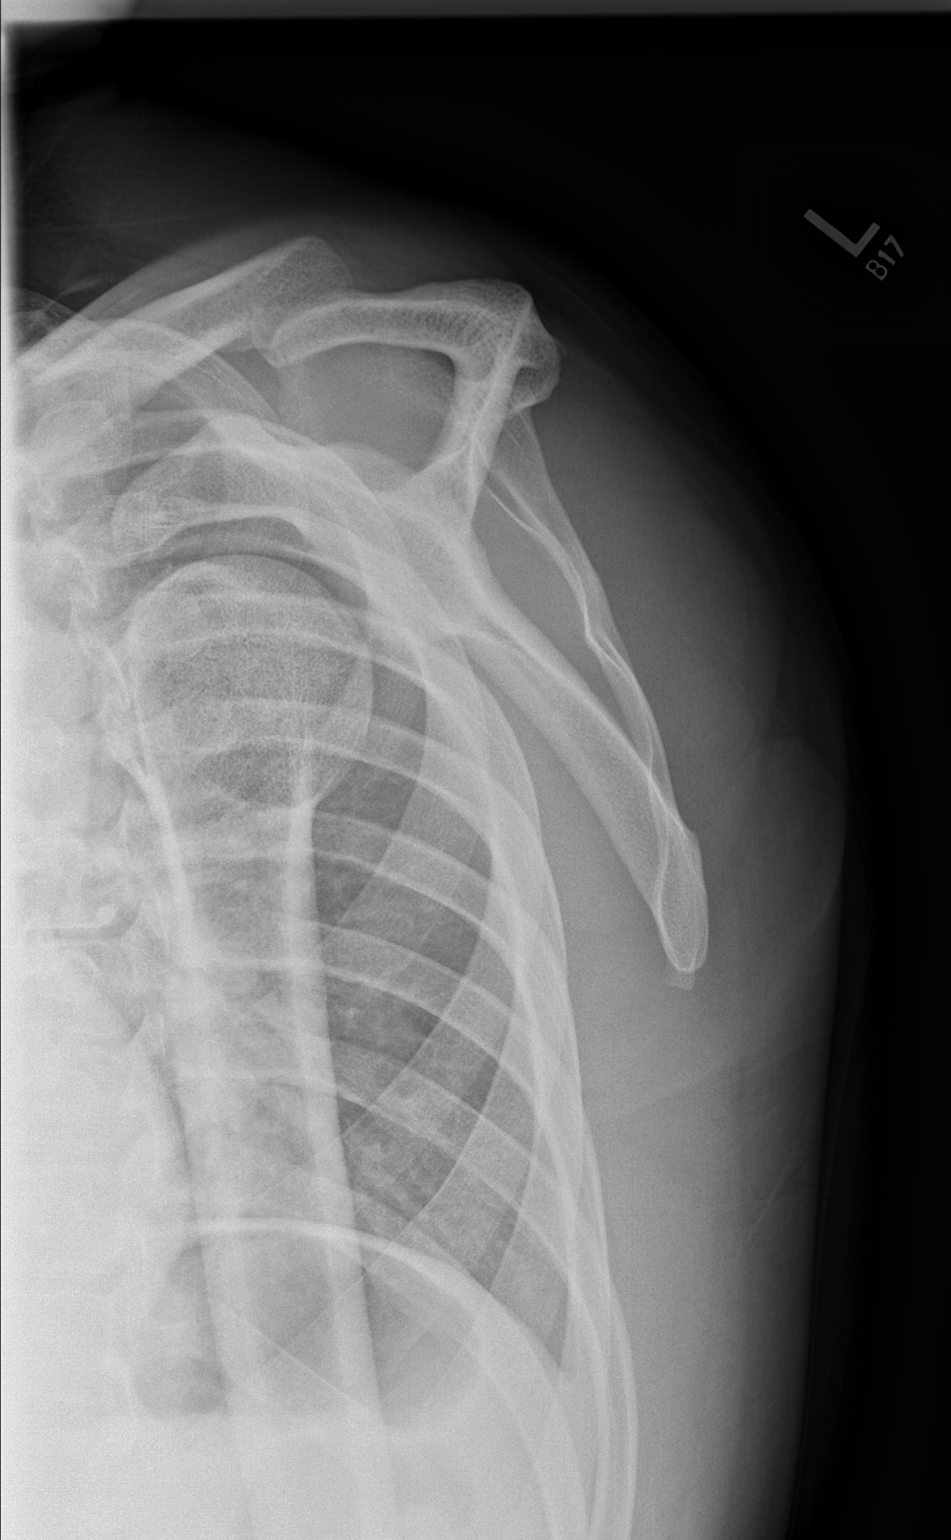

[2 of 2 positions shown; findings below may reference images not displayed]

FINDINGS: Anterior inferior dislocation of the humeral head. No visible
glenoid or humeral fracture at this time. Other structures appear
normal.
IMPRESSION: Anterior inferior dislocation of the humeral head.

## 2018-02-21 IMAGING — DX DG SHOULDER 1V*L*
1 series · 1 of 1 positions shown · non-contrast
Comparison: Acute left shoulder radiographs performed earlier today
at [DATE] p.m.

CLINICAL DATA: Status post reduction of left shoulder dislocation.
Initial encounter.

EXAM:
LEFT SHOULDER - 1 VIEW

[shoulder ap]
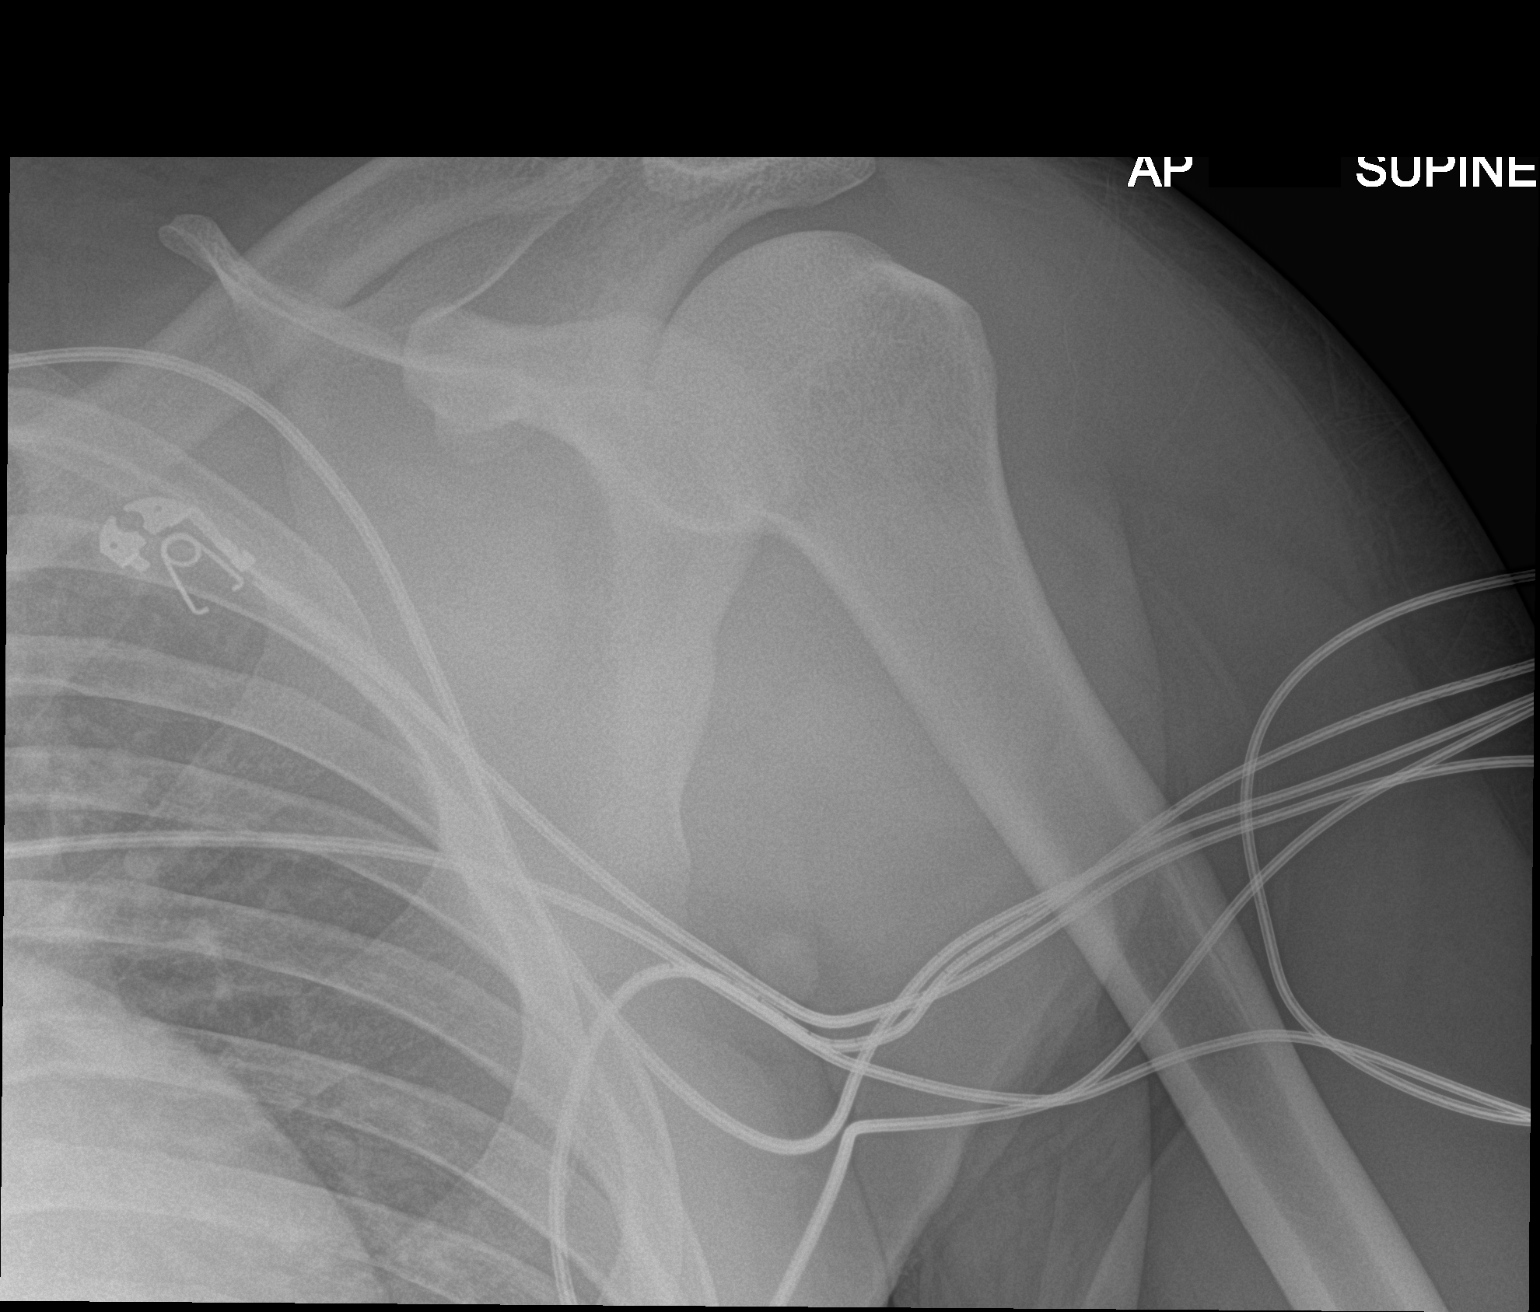

[1 of 1 positions shown; findings below may reference images not displayed]

FINDINGS: There has been successful reduction of the left glenohumeral joint
dislocation. The left acromioclavicular joint is unremarkable in
appearance. No definite fracture is seen.

No definite soft tissue abnormalities are characterized on
radiograph. The visualized portions of the left lung are clear.
IMPRESSION: Successful reduction of left glenohumeral joint dislocation.
